# Patient Record
Sex: Female | Born: 1985 | Race: White | Hispanic: No | Marital: Single | Smoking: Current every day smoker
Health system: Southern US, Community
[De-identification: ages and names within clinical notes are randomized; demographics above are authoritative.]

## PROBLEM LIST (undated history)

## (undated) DIAGNOSIS — F419 Anxiety disorder, unspecified: Secondary | ICD-10-CM

## (undated) DIAGNOSIS — G8929 Other chronic pain: Secondary | ICD-10-CM

## (undated) DIAGNOSIS — M549 Dorsalgia, unspecified: Secondary | ICD-10-CM

---

## 2016-11-03 ENCOUNTER — Encounter (HOSPITAL_COMMUNITY): Payer: Self-pay | Admitting: Emergency Medicine

## 2016-11-03 DIAGNOSIS — R300 Dysuria: Secondary | ICD-10-CM | POA: Diagnosis not present

## 2016-11-03 DIAGNOSIS — R252 Cramp and spasm: Secondary | ICD-10-CM | POA: Insufficient documentation

## 2016-11-03 DIAGNOSIS — N3090 Cystitis, unspecified without hematuria: Secondary | ICD-10-CM | POA: Insufficient documentation

## 2016-11-03 DIAGNOSIS — Z041 Encounter for examination and observation following transport accident: Secondary | ICD-10-CM | POA: Insufficient documentation

## 2016-11-03 DIAGNOSIS — F17200 Nicotine dependence, unspecified, uncomplicated: Secondary | ICD-10-CM | POA: Diagnosis not present

## 2016-11-03 DIAGNOSIS — M25551 Pain in right hip: Secondary | ICD-10-CM | POA: Diagnosis not present

## 2016-11-03 DIAGNOSIS — R102 Pelvic and perineal pain: Secondary | ICD-10-CM | POA: Diagnosis present

## 2016-11-03 LAB — PREGNANCY, URINE: PREG TEST UR: NEGATIVE

## 2016-11-03 LAB — URINALYSIS, ROUTINE W REFLEX MICROSCOPIC
Bilirubin Urine: NEGATIVE
GLUCOSE, UA: NEGATIVE mg/dL
HGB URINE DIPSTICK: NEGATIVE
Ketones, ur: NEGATIVE mg/dL
NITRITE: POSITIVE — AB
Protein, ur: 30 mg/dL — AB
SPECIFIC GRAVITY, URINE: 1.029 (ref 1.005–1.030)
pH: 5 (ref 5.0–8.0)
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
## 2016-11-04 ENCOUNTER — Emergency Department (HOSPITAL_COMMUNITY): Payer: No Typology Code available for payment source

## 2016-11-04 ENCOUNTER — Emergency Department (HOSPITAL_COMMUNITY)
Admission: EM | Admit: 2016-11-04 | Discharge: 2016-11-04 | Disposition: A | Discharge status: Home / Self Care | Payer: No Typology Code available for payment source | Attending: Emergency Medicine | Admitting: Emergency Medicine

## 2016-11-04 DIAGNOSIS — T07XXXA Unspecified multiple injuries, initial encounter: Secondary | ICD-10-CM

## 2016-11-04 DIAGNOSIS — R252 Cramp and spasm: Secondary | ICD-10-CM

## 2016-11-04 DIAGNOSIS — N3090 Cystitis, unspecified without hematuria: Secondary | ICD-10-CM

## 2016-11-04 LAB — CBC
HEMATOCRIT: 36.3 % (ref 36.0–46.0)
HEMOGLOBIN: 11.5 g/dL — AB (ref 12.0–15.0)
MCH: 28.5 pg (ref 26.0–34.0)
MCHC: 31.7 g/dL (ref 30.0–36.0)
MCV: 90.1 fL (ref 78.0–100.0)
Platelets: 278 10*3/uL (ref 150–400)
RBC: 4.03 MIL/uL (ref 3.87–5.11)
RDW: 12.1 % (ref 11.5–15.5)
WBC: 9.5 10*3/uL (ref 4.0–10.5)

## 2016-11-04 LAB — I-STAT CHEM 8, ED
BUN: 11 mg/dL (ref 6–20)
CREATININE: 0.8 mg/dL (ref 0.44–1.00)
Calcium, Ion: 1.14 mmol/L — ABNORMAL LOW (ref 1.15–1.40)
Chloride: 102 mmol/L (ref 101–111)
GLUCOSE: 88 mg/dL (ref 65–99)
HCT: 36 % (ref 36.0–46.0)
HEMOGLOBIN: 12.2 g/dL (ref 12.0–15.0)
POTASSIUM: 3.1 mmol/L — AB (ref 3.5–5.1)
Sodium: 142 mmol/L (ref 135–145)
TCO2: 28 mmol/L (ref 22–32)

## 2016-11-04 MED ORDER — HYDROCODONE-ACETAMINOPHEN 5-325 MG PO TABS
1.0000 | ORAL_TABLET | Freq: Once | ORAL | Status: AC
  Administered 2016-11-04: 1 via ORAL
  Filled 2016-11-04: qty 1

## 2016-11-04 MED ORDER — NAPROXEN 500 MG PO TABS: 500.0000 mg | ORAL_TABLET | Freq: Two times a day (BID) | ORAL | 0 refills | Status: AC

## 2016-11-04 MED ORDER — NITROFURANTOIN MONOHYD MACRO 100 MG PO CAPS
100.0000 mg | ORAL_CAPSULE | Freq: Once | ORAL | Status: AC
  Administered 2016-11-04: 100 mg via ORAL
  Filled 2016-11-04: qty 1

## 2016-11-04 MED ORDER — METHOCARBAMOL 500 MG PO TABS: 500.0000 mg | ORAL_TABLET | Freq: Two times a day (BID) | ORAL | 0 refills | Status: AC

## 2016-11-04 MED ORDER — NITROFURANTOIN MONOHYD MACRO 100 MG PO CAPS: 100.0000 mg | ORAL_CAPSULE | Freq: Two times a day (BID) | ORAL | 0 refills | Status: AC
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
## 2016-11-04 NOTE — ED Notes (Signed)
Called for vitals x1 

## 2016-11-04 NOTE — ED Notes (Signed)
Called for vitals x2 
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
## 2016-11-04 NOTE — ED Notes (Signed)
Patient transported to X-ray 

## 2016-11-04 NOTE — ED Notes (Signed)
Pt verbalizes understanding of d/c instructions. Pt received prescriptions and ambulatory at discharge.

## 2017-01-10 ENCOUNTER — Emergency Department (HOSPITAL_COMMUNITY)
Admission: EM | Admit: 2017-01-10 | Discharge: 2017-01-10 | Disposition: A | Discharge status: Home / Self Care | Payer: Self-pay | Attending: Physician Assistant | Admitting: Physician Assistant

## 2017-01-10 ENCOUNTER — Encounter (HOSPITAL_COMMUNITY): Payer: Self-pay | Admitting: Emergency Medicine

## 2017-01-10 DIAGNOSIS — M549 Dorsalgia, unspecified: Secondary | ICD-10-CM

## 2017-01-10 DIAGNOSIS — M546 Pain in thoracic spine: Secondary | ICD-10-CM | POA: Insufficient documentation

## 2017-01-10 DIAGNOSIS — M62830 Muscle spasm of back: Secondary | ICD-10-CM | POA: Insufficient documentation

## 2017-01-10 DIAGNOSIS — Z79899 Other long term (current) drug therapy: Secondary | ICD-10-CM | POA: Insufficient documentation

## 2017-01-10 DIAGNOSIS — F17290 Nicotine dependence, other tobacco product, uncomplicated: Secondary | ICD-10-CM | POA: Insufficient documentation

## 2017-01-10 MED ORDER — PREDNISONE 10 MG (21) PO TBPK: ORAL_TABLET | Freq: Every day | ORAL | 0 refills | Status: AC

## 2017-01-10 MED ORDER — ACETAMINOPHEN 500 MG PO TABS
1000.0000 mg | ORAL_TABLET | Freq: Once | ORAL | Status: AC
  Administered 2017-01-10: 1000 mg via ORAL
  Filled 2017-01-10: qty 2

## 2017-01-10 MED ORDER — LIDOCAINE 5 % EX PTCH: 1.0000 | MEDICATED_PATCH | CUTANEOUS | 0 refills | Status: AC

## 2017-01-10 MED ORDER — CYCLOBENZAPRINE HCL 10 MG PO TABS: 10.0000 mg | ORAL_TABLET | Freq: Two times a day (BID) | ORAL | 0 refills | Status: DC | PRN

## 2017-01-10 MED ORDER — CYCLOBENZAPRINE HCL 10 MG PO TABS
5.0000 mg | ORAL_TABLET | Freq: Once | ORAL | Status: AC
  Administered 2017-01-10: 5 mg via ORAL
  Filled 2017-01-10: qty 1

## 2017-01-10 MED ORDER — PREDNISONE 20 MG PO TABS
60.0000 mg | ORAL_TABLET | Freq: Once | ORAL | Status: AC
  Administered 2017-01-10: 60 mg via ORAL
  Filled 2017-01-10: qty 3

## 2017-01-10 NOTE — ED Notes (Signed)
ED Provider at bedside. 
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
## 2017-01-10 NOTE — ED Triage Notes (Signed)
Pt. Stated, Im having back pain and Im suppose to see my doctor in Dec. But I fell yesterday and it hurts and I couldn't wait.  Ive also been bleeding for  Weeks.

## 2017-01-10 NOTE — ED Notes (Signed)
Pt verbalized understanding of discharge instructions and denies any further questions at this time.   

## 2017-01-10 NOTE — Discharge Instructions (Signed)
Take prednisone as prescribed. Use muscle relaxer as needed for muscle stiffness, soreness, or spasms.  Have caution, as this may make you tired.  Do not drive or operate heavy machinery until you know how this medicine affects you. Use Lidoderm patches as needed for pain. Use ice or heat if this helps control your back pain. Try to avoid anti-inflammatories including ibuprofen, Advil, Motrin, BC, Goody powders, or aspirin while taking prednisone, as this may cause stomach upset.  Instead, you should use Tylenol for further pain control.  You may take up to 1000 mg 3 times a day. Follow-up as scheduled with your back doctor. Return to the emergency room if you develop fevers, chills, loss of bowel or bladder control, difficulty walking, or any new or worsening symptoms.

## 2017-01-15 ENCOUNTER — Emergency Department (HOSPITAL_COMMUNITY)
Admission: EM | Admit: 2017-01-15 | Discharge: 2017-01-15 | Disposition: A | Discharge status: Home / Self Care | Payer: Self-pay | Attending: Emergency Medicine | Admitting: Emergency Medicine

## 2017-01-15 ENCOUNTER — Encounter (HOSPITAL_COMMUNITY): Payer: Self-pay | Admitting: Emergency Medicine

## 2017-01-15 DIAGNOSIS — F1320 Sedative, hypnotic or anxiolytic dependence, uncomplicated: Secondary | ICD-10-CM | POA: Insufficient documentation

## 2017-01-15 DIAGNOSIS — F17210 Nicotine dependence, cigarettes, uncomplicated: Secondary | ICD-10-CM | POA: Insufficient documentation

## 2017-01-15 DIAGNOSIS — F419 Anxiety disorder, unspecified: Secondary | ICD-10-CM | POA: Insufficient documentation

## 2017-01-15 DIAGNOSIS — F1111 Opioid abuse, in remission: Secondary | ICD-10-CM

## 2017-01-15 DIAGNOSIS — Z8659 Personal history of other mental and behavioral disorders: Secondary | ICD-10-CM | POA: Insufficient documentation

## 2017-01-15 DIAGNOSIS — F1310 Sedative, hypnotic or anxiolytic abuse, uncomplicated: Secondary | ICD-10-CM

## 2017-01-15 DIAGNOSIS — G894 Chronic pain syndrome: Secondary | ICD-10-CM | POA: Insufficient documentation

## 2017-01-15 DIAGNOSIS — Z79899 Other long term (current) drug therapy: Secondary | ICD-10-CM | POA: Insufficient documentation

## 2017-01-15 HISTORY — DX: Other chronic pain: G89.29

## 2017-01-15 HISTORY — DX: Anxiety disorder, unspecified: F41.9

## 2017-01-15 HISTORY — DX: Dorsalgia, unspecified: M54.9

## 2017-01-15 NOTE — ED Notes (Signed)
Bed: [facility]4 Expected date:  Expected time:  Means of arrival:  Comments: 
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
## 2017-01-15 NOTE — ED Triage Notes (Signed)
Per [facility] patient comes from home for anxiety that started this morning.  Patient having dry heaves but no vomiting with EMS. Vitals: 124/90, 88HR, 97% on room air, CBG 97, R20-24

## 2017-01-15 NOTE — ED Provider Notes (Signed)
[facility]-EMERGENCY DEPT Provider Note   CSN: [id number] Arrival date & time: 01/15/17  0827     History   Chief Complaint Chief Complaint  Patient presents with  . Anxiety    HPI Christina Page is a 31 y.o. female.  HPI 31 yo female with a hx of opioid and benzo abuse who presents with anxity without SI and HI requesting "something for my nerves".  Requests xanax. No other complaints. Fevers and chills.    Past Medical History:  Diagnosis Date  . Anxiety   . Chronic back pain     There are no active problems to display for this patient.   History reviewed. No pertinent surgical history.  OB History    No data available       Home Medications    Prior to Admission medications   Medication Sig Start Date End Date Taking? Authorizing Provider  cyclobenzaprine (FLEXERIL) 10 MG tablet Take 1 tablet (10 mg total) 2 (two) times daily as needed by mouth for muscle spasms. 01/10/17   Caccavale, Sophia, PA-C  ibuprofen (ADVIL,MOTRIN) 200 MG tablet Take 400 mg by mouth every 6 (six) hours as needed for mild pain.    [provider]  lidocaine (LIDODERM) 5 % Place 1 patch daily onto the skin. Remove & Discard patch within 12 hours or as directed by MD 01/10/17   Caccavale, Sophia, PA-C  methocarbamol (ROBAXIN) 500 MG tablet Take 1 tablet (500 mg total) by mouth 2 (two) times daily. 11/04/16   Derwood KaplanNanavati, Ankit, MD  naproxen (NAPROSYN) 500 MG tablet Take 1 tablet (500 mg total) by mouth 2 (two) times daily. 11/04/16   Derwood KaplanNanavati, Ankit, MD  nitrofurantoin, macrocrystal-monohydrate, (MACROBID) 100 MG capsule Take 1 capsule (100 mg total) by mouth 2 (two) times daily. 11/04/16   Derwood KaplanNanavati, Ankit, MD  predniSONE (STERAPRED UNI-PAK 21 TAB) 10 MG (21) TBPK tablet Take daily by mouth. Take 6 tabs by mouth daily  for 2 days, then 5 tabs for 2 days, then 4 tabs for 2 days, then 3 tabs for 2 days, 2 tabs for 2 days, then 1 tab by mouth daily for 2 days 01/10/17    Caccavale, Sophia, PA-C    Family History No family history on file.  Social History Social History   Tobacco Use  . Smoking status: Current Every Day Smoker    Types: E-cigarettes  . Smokeless tobacco: Never Used  Substance Use Topics  . Alcohol use: No  . Drug use: No     Allergies   Sulfa antibiotics and Pork-derived products   Review of Systems Review of Systems  All other systems reviewed and are negative.    Physical Exam Updated Vital Signs BP 119/84 (BP Location: Left Arm)   Pulse 96   Temp 97.8 F (36.6 C) (Oral)   Resp 20   Ht 5\' 5"  (1.651 m)   Wt 63.5 kg (140 lb)   LMP 01/11/2017   SpO2 100%   BMI 23.30 kg/m   Physical Exam  Constitutional: She is oriented to person, place, and time. She appears well-developed and well-nourished.  HENT:  Head: Normocephalic.  Eyes: EOM are normal.  Neck: Normal range of motion.  Pulmonary/Chest: Effort normal.  Abdominal: She exhibits no distension.  Musculoskeletal: Normal range of motion.  Neurological: She is alert and oriented to person, place, and time.  Psychiatric: She has a normal mood and affect.  Nursing note and vitals reviewed.    ED Treatments /  Results  Labs (all labs ordered are listed, but only abnormal results are displayed) Labs Reviewed - No data to display  EKG  EKG Interpretation None       Radiology No results found.  Procedures Procedures (including critical care time)  Medications Ordered in ED Medications - No data to display   Initial Impression / Assessment and Plan / ED Course  I have reviewed the triage vital signs and the nursing notes.  Pertinent labs & imaging results that were available during my care of the patient were reviewed by me and considered in my medical decision making (see chart for details).     mse complete. Dc home in good condition  Final Clinical Impressions(s) / ED Diagnoses   Final diagnoses:  Anxiety  Benzodiazepine abuse (HCC)    Chronic pain syndrome  History of opioid abuse    ED Discharge Orders    None       Azalia Bilisampos, Javaya [place], MD 01/15/17 [phone]

## 2017-02-08 ENCOUNTER — Ambulatory Visit (INDEPENDENT_AMBULATORY_CARE_PROVIDER_SITE_OTHER): Payer: Self-pay

## 2017-02-08 ENCOUNTER — Ambulatory Visit (INDEPENDENT_AMBULATORY_CARE_PROVIDER_SITE_OTHER): Payer: Self-pay | Admitting: Specialist

## 2017-02-08 ENCOUNTER — Encounter (INDEPENDENT_AMBULATORY_CARE_PROVIDER_SITE_OTHER): Payer: Self-pay | Admitting: Specialist

## 2017-02-08 VITALS — BP 123/85 | HR 53 | Ht 65.0 in | Wt 143.0 lb

## 2017-02-08 DIAGNOSIS — M4854XA Collapsed vertebra, not elsewhere classified, thoracic region, initial encounter for fracture: Secondary | ICD-10-CM

## 2017-02-08 DIAGNOSIS — M545 Low back pain: Secondary | ICD-10-CM

## 2017-02-08 DIAGNOSIS — M542 Cervicalgia: Secondary | ICD-10-CM

## 2017-02-08 DIAGNOSIS — M546 Pain in thoracic spine: Secondary | ICD-10-CM

## 2017-02-08 MED ORDER — CYCLOBENZAPRINE HCL 10 MG PO TABS: 10.0000 mg | ORAL_TABLET | Freq: Two times a day (BID) | ORAL | 0 refills | Status: AC | PRN

## 2017-02-08 MED ORDER — GABAPENTIN 300 MG PO CAPS: 300.0000 mg | ORAL_CAPSULE | Freq: Every day | ORAL | 3 refills | Status: AC

## 2017-02-08 NOTE — Patient Instructions (Signed)
Plan:Avoid frequent bending and stooping  No lifting greater than 10 lbs. May use ice or moist heat for pain. Weight loss is of benefit. 
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
## 2017-02-13 ENCOUNTER — Encounter (INDEPENDENT_AMBULATORY_CARE_PROVIDER_SITE_OTHER): Payer: Self-pay | Admitting: *Deleted
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
